# Patient Record
Sex: Female | Born: 1970 | Hispanic: Yes | Marital: Married | Smoking: Never smoker
Health system: Southern US, Community
[De-identification: ages and names within clinical notes are randomized; demographics above are authoritative.]

## PROBLEM LIST (undated history)

## (undated) DIAGNOSIS — E785 Hyperlipidemia, unspecified: Secondary | ICD-10-CM

## (undated) HISTORY — DX: Hyperlipidemia, unspecified: E78.5

---

## 2004-11-07 HISTORY — PX: BREAST EXCISIONAL BIOPSY: SUR124

## 2004-11-07 HISTORY — PX: BREAST CYST ASPIRATION: SHX578

## 2005-03-24 ENCOUNTER — Ambulatory Visit: Payer: Self-pay | Admitting: Family Medicine

## 2005-04-04 ENCOUNTER — Ambulatory Visit: Payer: Self-pay | Admitting: Family Medicine

## 2005-04-26 ENCOUNTER — Ambulatory Visit: Payer: Self-pay | Admitting: General Surgery

## 2005-05-26 ENCOUNTER — Ambulatory Visit: Payer: Self-pay | Admitting: General Surgery

## 2006-01-04 ENCOUNTER — Ambulatory Visit: Payer: Self-pay | Admitting: General Surgery

## 2006-07-14 ENCOUNTER — Ambulatory Visit: Payer: Self-pay | Admitting: General Surgery

## 2007-07-16 ENCOUNTER — Ambulatory Visit: Payer: Self-pay | Admitting: General Surgery

## 2008-08-26 ENCOUNTER — Ambulatory Visit: Payer: Self-pay

## 2008-11-07 HISTORY — PX: BREAST EXCISIONAL BIOPSY: SUR124

## 2008-11-14 ENCOUNTER — Ambulatory Visit: Payer: Self-pay | Admitting: Surgery

## 2008-11-21 ENCOUNTER — Ambulatory Visit: Payer: Self-pay | Admitting: Surgery

## 2009-12-22 ENCOUNTER — Ambulatory Visit: Payer: Self-pay

## 2010-11-05 ENCOUNTER — Ambulatory Visit: Payer: Self-pay | Admitting: Surgery

## 2011-11-09 ENCOUNTER — Ambulatory Visit: Payer: Self-pay

## 2013-01-02 ENCOUNTER — Ambulatory Visit: Payer: Self-pay

## 2013-12-30 ENCOUNTER — Emergency Department: Payer: Self-pay | Admitting: Surgery

## 2013-12-30 LAB — URINALYSIS, COMPLETE
BILIRUBIN, UR: NEGATIVE
Bacteria: NONE SEEN
Glucose,UR: NEGATIVE mg/dL (ref 0–75)
KETONE: NEGATIVE
Leukocyte Esterase: NEGATIVE
Nitrite: NEGATIVE
PH: 7 (ref 4.5–8.0)
PROTEIN: NEGATIVE
SPECIFIC GRAVITY: 1.009 (ref 1.003–1.030)
Squamous Epithelial: <1

## 2013-12-30 LAB — COMPREHENSIVE METABOLIC PANEL
ALBUMIN: 4 g/dL (ref 3.4–5.0)
ALT: 23 U/L (ref 12–78)
Alkaline Phosphatase: 88 U/L
Anion Gap: 1 — ABNORMAL LOW (ref 7–16)
BILIRUBIN TOTAL: 0.3 mg/dL (ref 0.2–1.0)
BUN: 14 mg/dL (ref 7–18)
CHLORIDE: 102 mmol/L (ref 98–107)
Calcium, Total: 9.1 mg/dL (ref 8.5–10.1)
Co2: 32 mmol/L (ref 21–32)
Creatinine: 0.71 mg/dL (ref 0.60–1.30)
EGFR (African American): >60
Glucose: 103 mg/dL — ABNORMAL HIGH (ref 65–99)
OSMOLALITY: 271 (ref 275–301)
POTASSIUM: 3.7 mmol/L (ref 3.5–5.1)
SGOT(AST): 25 U/L (ref 15–37)
SODIUM: 135 mmol/L — AB (ref 136–145)
Total Protein: 8.2 g/dL (ref 6.4–8.2)

## 2013-12-30 LAB — CBC WITH DIFFERENTIAL/PLATELET
BASOS PCT: 0.5 %
Basophil #: 0 10*3/uL (ref 0.0–0.1)
EOS ABS: 0.2 10*3/uL (ref 0.0–0.7)
EOS PCT: 2.9 %
HCT: 41.3 % (ref 35.0–47.0)
HGB: 14.1 g/dL (ref 12.0–16.0)
LYMPHS ABS: 1.7 10*3/uL (ref 1.0–3.6)
Lymphocyte %: 31.9 %
MCH: 31.3 pg (ref 26.0–34.0)
MCHC: 34.1 g/dL (ref 32.0–36.0)
MCV: 92 fL (ref 80–100)
Monocyte #: 0.4 x10 3/mm (ref 0.2–0.9)
Monocyte %: 6.8 %
Neutrophil #: 3 10*3/uL (ref 1.4–6.5)
Neutrophil %: 57.9 %
Platelet: 198 10*3/uL (ref 150–440)
RBC: 4.5 10*6/uL (ref 3.80–5.20)
RDW: 12.3 % (ref 11.5–14.5)
WBC: 5.2 10*3/uL (ref 3.6–11.0)

## 2013-12-30 LAB — LIPASE, BLOOD: Lipase: 117 U/L (ref 73–393)

## 2013-12-30 LAB — PREGNANCY, URINE: PREGNANCY TEST, URINE: NEGATIVE m[IU]/mL

## 2014-01-17 ENCOUNTER — Ambulatory Visit: Payer: Self-pay | Admitting: Family Medicine

## 2015-01-12 IMAGING — CT CT ABD-PELV W/ CM
2 of 5 series · 15 of 46 positions shown, 17 images · IV contrast (isovue)
Comparison: None.

CLINICAL DATA: Generalized abdominal pain which was earlier
left-sided. The patient reports vomiting

EXAM:
CT ABDOMEN AND PELVIS WITH CONTRAST
TECHNIQUE: Multidetector CT imaging of the abdomen and pelvis was performed
using the standard protocol following bolus administration of
intravenous contrast.
CONTRAST:  100 cc of Isovue 370 intravenously. The patient also
received oral contrast material.

[Series 2: routine abd pel with · axial · 0.68mm/px · z∈[-1013,-613]mm · 12 of 91 slices shown, 14 images]
[im 6/91  soft-tissue]
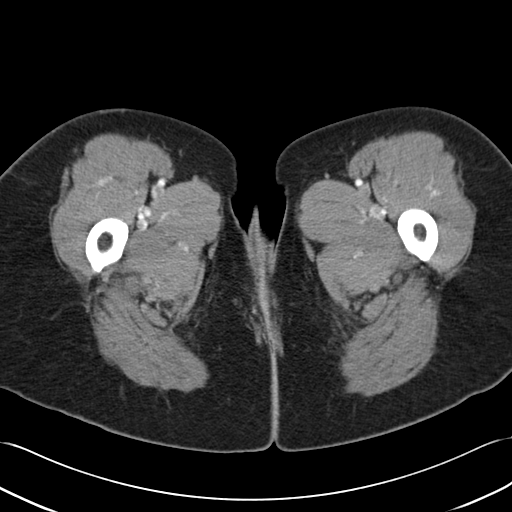
[im 6/91  bone]
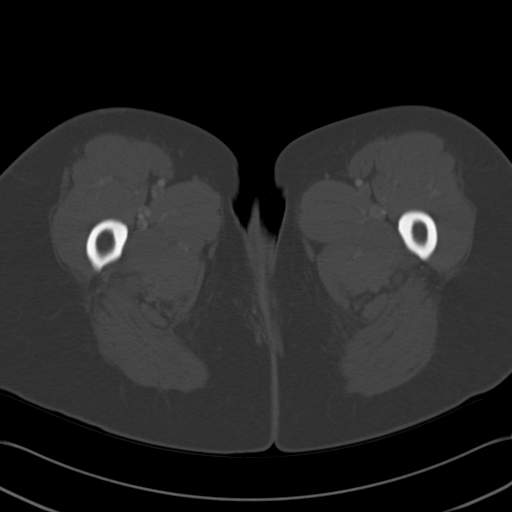
[im 16/91  soft-tissue]
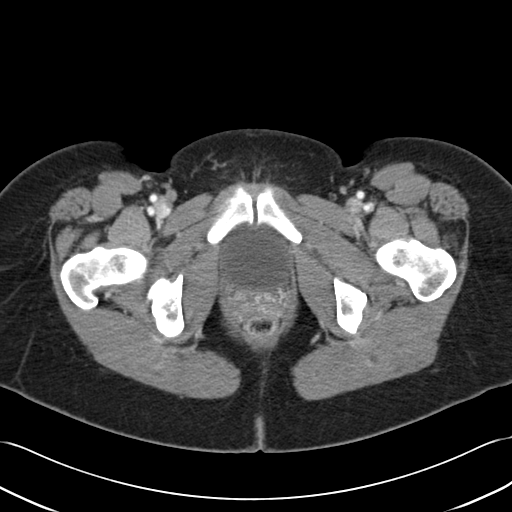
[im 21/91  soft-tissue]
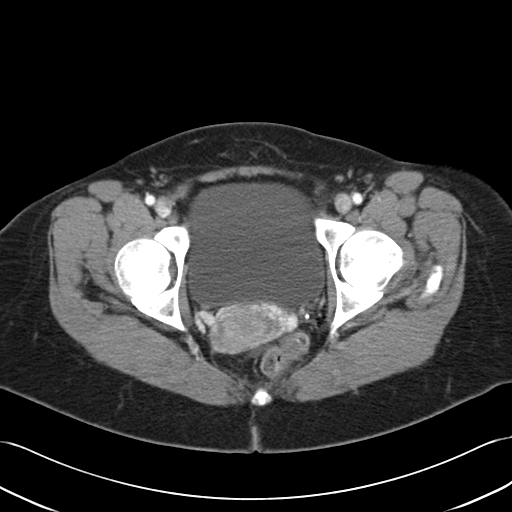
[im 26/91  soft-tissue]
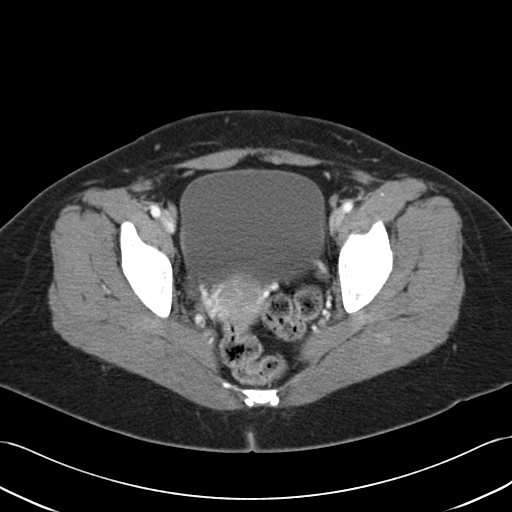
[im 36/91  soft-tissue]
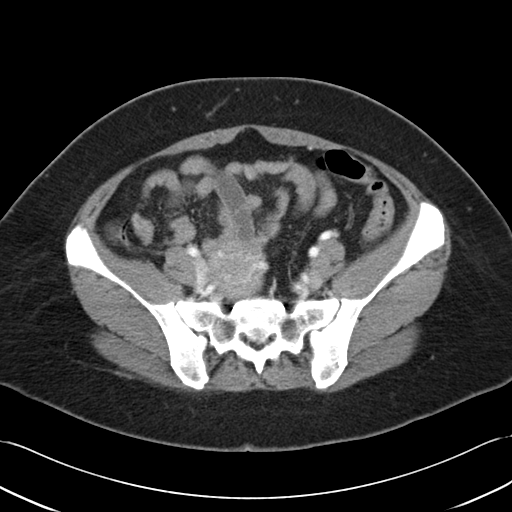
[im 41/91  soft-tissue]
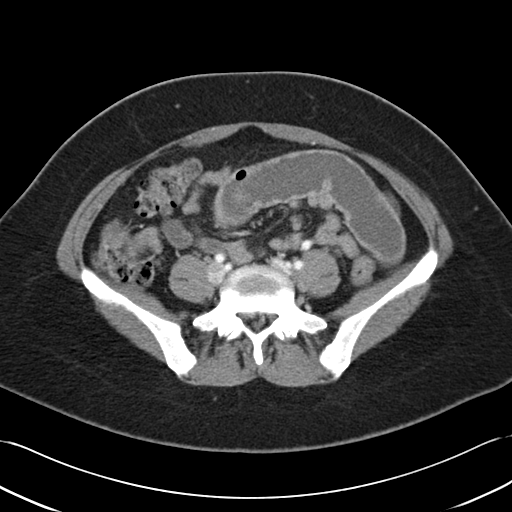
[im 51/91  soft-tissue]
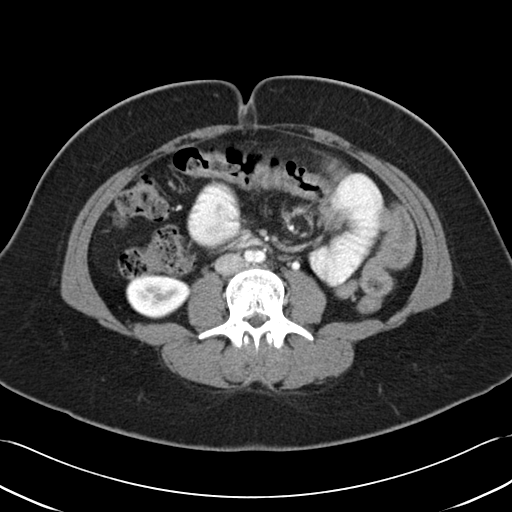
[im 56/91  soft-tissue]
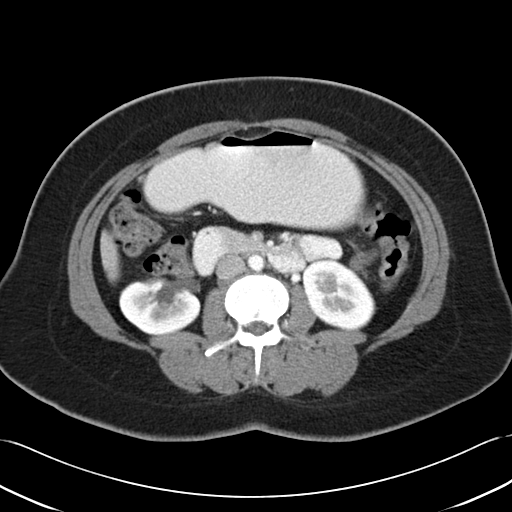
[im 66/91  soft-tissue]
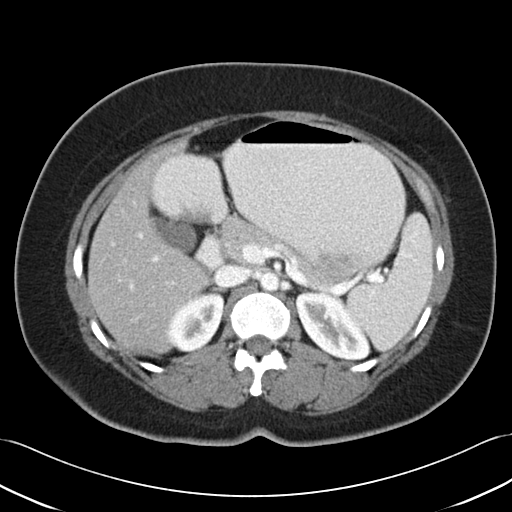
[im 66/91  bone]
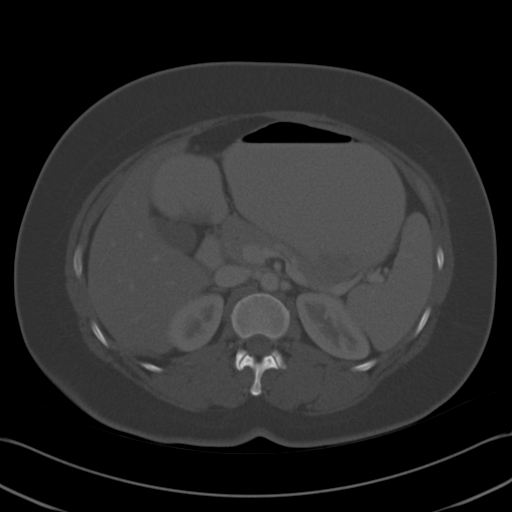
[im 71/91  soft-tissue]
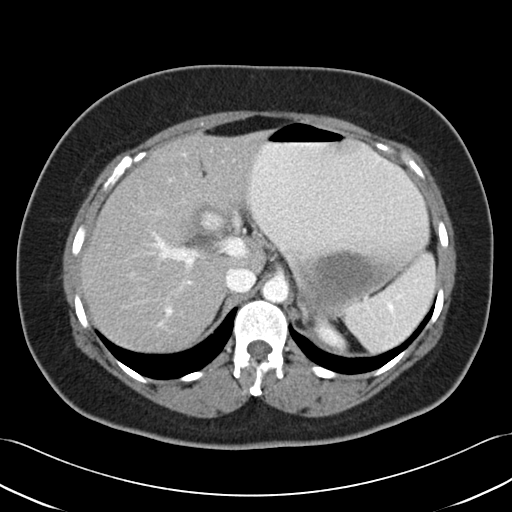
[im 76/91  soft-tissue]
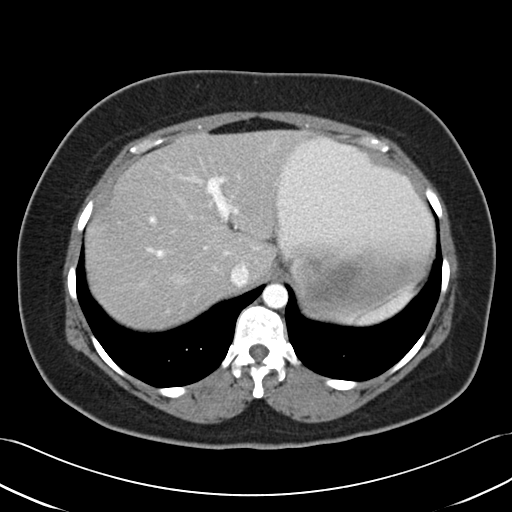
[im 86/91  soft-tissue]
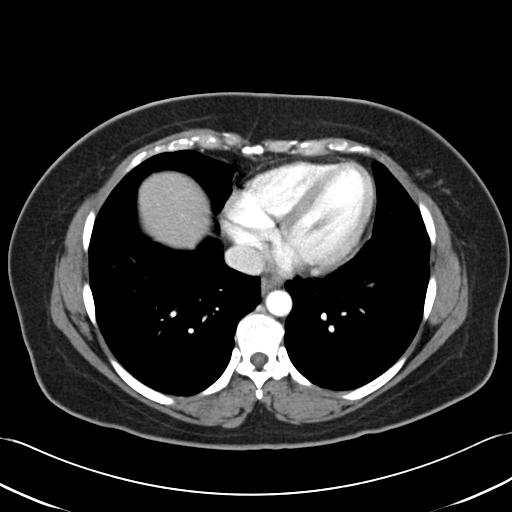

[Series 5: cor routine abd pel with · coronal · 0.60mm/px · 3 of 116 slices shown]
[im 39/116  soft-tissue]
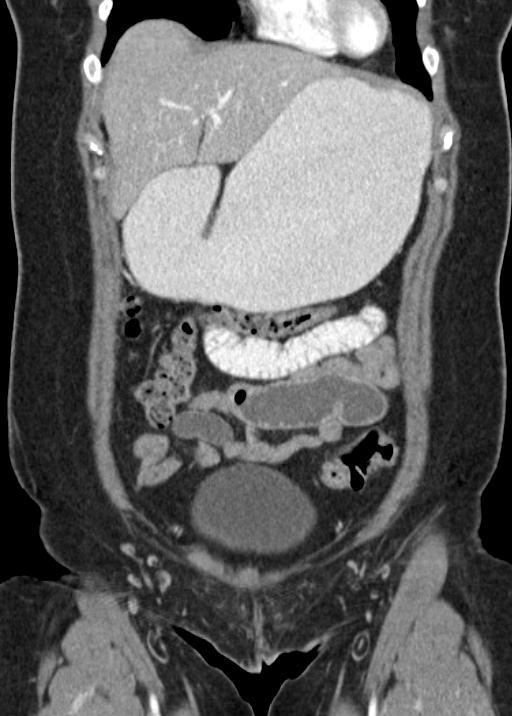
[im 52/116  soft-tissue]
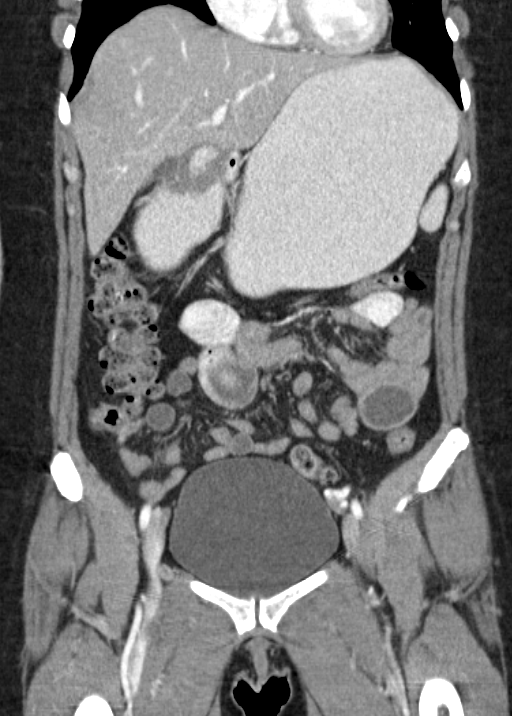
[im 64/116  soft-tissue]
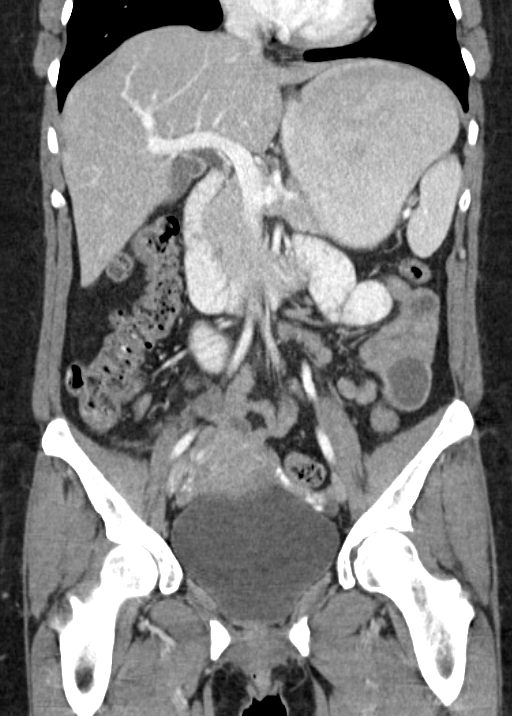

[15 of 46 positions shown; findings below may reference images not displayed]

FINDINGS: The stomach is moderately distended with contrast and food particles
and a small amount of gas. Loops of contrast filled proximal jejunum
are demonstrated in the upper abdomen. However, there is a
transition zone demonstrated on images 43 through 51 in the proximal
jejunum that has the appearance of an intussusception. Distal to
this no contrast is demonstrated. There is minimal distention of
bowel distal to the suspected site of intussusception which is
demonstrated on images 44 through 51. The colon exhibits a moderate
amount of stool in the ascending portion and in the rectosigmoid but
there is no evidence of obstruction. The appendix is normal in
caliber and partially gas-filled.

The liver, gallbladder, spleen, pancreas, adrenal glands, and
kidneys are normal in appearance. The caliber of the abdominal aorta
is normal. The psoas musculature is normal in appearance. The
urinary bladder is moderately distended. The uterus and adnexal
structures are within the limits of normal. There is no free fluid
within the pelvis. There is no inguinal nor significant umbilical
hernia.

The lung bases are clear. The lumbar vertebral bodies are preserved
in height.
IMPRESSION: 1. There is a relatively high-grade proximal jejunal obstruction
which appears to be related jejuno-jejunal intussusception. No
suspicious mesenteric mass is demonstrated. No bulky mesenteric
lymph nodes are demonstrated. The distal small bowel, appendix, and
colon exhibit no acute abnormalities.
2. There is no acute hepatobiliary or acute urinary tract
abnormality.
3. There is no free fluid in the abdomen or pelvis.
4. These results were called by telephone at the time of
interpretation on 12/30/2013 at [DATE] to Harsh De Oliveira, NP, who
verbally acknowledged these results.

## 2015-01-19 ENCOUNTER — Ambulatory Visit: Payer: Self-pay | Admitting: Family Medicine

## 2015-02-28 NOTE — Consult Note (Signed)
PATIENT NAME:  Martha Diaz, Martha Diaz MR#:  [id number] DATE OF BIRTH:  05-Jul-1971  DATE OF CONSULTATION:  12/30/2013  CONSULTING PHYSICIAN:  Loraine LericheMark A. Egbert GaribaldiBird, MD  HISTORY OF PRESENT ILLNESS: This is a 44 year old otherwise healthy Hispanic female who presented to the Emergency Room with 9 month history of nocturnal left upper quadrant intermittent abdominal pain. Over the last several days, the patient has had worsening abdominal pain and presents to the Emergency Room today with acute nausea, vomiting and severe left upper quadrant abdominal pain. Workup in the Emergency Room with a CT scan with oral contrast demonstrates what appears to be a complete small bowel obstruction of the mid jejunum secondary to intussusception. There is no evidence of mesenteric lymphadenopathy. There is no free fluid. There is no pneumatosis intestinalis. Surgical services were asked to evaluate.  She had a large emesis in ER and now is pain free.   ALLERGIES: None.   MEDICATIONS: None.   PAST MEDICAL HISTORY: None.   PAST SURGICAL HISTORY: None.   SOCIAL HISTORY: She is employed and married. Does not smoke. Does not drink.   PHYSICAL EXAMINATION: VITAL SIGNS:  The patient has a nasogastric tube in place. Draining bilious fluid. Temperature is 97.5, pulse of 60. Blood pressure is 150/71.  GENERAL:  Affect is normal.  LUNGS: Clear.  HEART: Regular rate and rhythm.  NECK: Without adenopathy or thyromegaly.  ABDOMEN: Soft and nontender. There are no scars. No hernia present.  EXTREMITIES: Warm and well perfused.  NEUROPSYCHIATRIC: Unremarkable.   REVIEW OF SYSTEMS: As described above.     LABORATORY VALUES: Urine pregnancy test is negative. Urinalysis is negative. White count 5.2, hemoglobin 14.1, sodium 135, lipase 117. Liver function tests are normal. Platelet count 198,000.   Review of CT scan was personally interpreted the on the PACS monitor and I agree with the radiologist's interpretation.   IMPRESSION: A  44 year old Hispanic white female without past medical history, long-standing history of abdominal pain and acute onset of nausea, vomiting and abdominal pain in left upper quadrant with the findings on CT scan suggestive of complete bowel obstruction secondary to jejunal intussusception.   RECOMMENDATIONS: I recommended to the patient admission to the hospital, nasogastric tube decompression. Followup x-rays in the morning and likely surgical intervention given the chronicity of her disease and the likelihood of an associated pathological finding as the lead point for the intussusception. With the help of a Spanish interpreter, this was discussed with her at length. The patient decided that she would like to go home. I told her that this would be AGAINST MEDICAL ADVICE and had her sign the appropriate forms. I did give my contact information and encouraged her to return or follow up in the office when she saw fit. She understands the gravity of the nature of the problem and the likelihood of her returning if the intussusception is continuing, which I think it will be. There was a substantial amount of edema seen on the CT scan, and I think that all of her symptoms are suggestive of a worsening process, which will likely need surgical intervention in the form of a bowel obstruction.   TOTAL TIME SPENT: 60 minutes.   ____________________________ Redge GainerMark A. Egbert GaribaldiBird, MD FACS mab:dmm D: 12/30/2013 20:45:20 ET Diaz: 12/30/2013 21:25:59 ET JOB#: [id number]  cc: Loraine LericheMark A. Egbert GaribaldiBird, MD, <Dictator> Deantae Shackleton A Berdine Rasmusson MD ELECTRONICALLY SIGNED 01/08/2014 10:06

## 2016-01-27 ENCOUNTER — Ambulatory Visit
Admission: RE | Admit: 2016-01-27 | Discharge: 2016-01-27 | Disposition: A | Discharge status: Home / Self Care | Payer: Self-pay | Source: Ambulatory Visit | Attending: Oncology | Admitting: Oncology

## 2016-01-27 ENCOUNTER — Ambulatory Visit: Payer: Self-pay | Attending: Oncology

## 2016-01-27 VITALS — BP 108/72 | HR 62 | Temp 98.2°F | Ht 60.63 in | Wt 168.7 lb

## 2016-01-27 DIAGNOSIS — Z0000 Encounter for general adult medical examination without abnormal findings: Secondary | ICD-10-CM

## 2016-01-27 NOTE — Progress Notes (Signed)
Subjective:     Patient ID: Martha Diaz, female   DOB: 11/01/1971, 45 y.o.   MRN: [id number]  HPI   Review of Systems     Objective:   Physical Exam  Pulmonary/Chest: Right breast exhibits no inverted nipple, no mass, no nipple discharge, no skin change and no tenderness. Left breast exhibits no inverted nipple, no mass, no nipple discharge, no skin change and no tenderness. Breasts are symmetrical.       Assessment:     45 year old Hispanic patient presents for [facility] clinic visit.  Patient screened, and meets [facility] eligibility.  Patient does not have insurance, Medicare or Medicaid.  Handout given on Affordable Care Act.  Instructed patient on breast self-exam using teach back method.  CBE unremarkable.  No mass or lump palpated. Delos HaringLoyda Murr interpreted exam.    Plan:     Sent for bilateral screening mammogram.

## 2016-02-02 NOTE — Progress Notes (Signed)
Letter mailed from [facility] to notify of normal mammogram results.  Patient to return in one year for annual screening.  Copy to [facility]. 

## 2017-01-30 ENCOUNTER — Ambulatory Visit
Admission: RE | Admit: 2017-01-30 | Discharge: 2017-01-30 | Disposition: A | Discharge status: Home / Self Care | Payer: Self-pay | Source: Ambulatory Visit | Attending: Oncology | Admitting: Oncology

## 2017-01-30 ENCOUNTER — Ambulatory Visit: Payer: Self-pay | Attending: Oncology | Admitting: *Deleted

## 2017-01-30 ENCOUNTER — Encounter (INDEPENDENT_AMBULATORY_CARE_PROVIDER_SITE_OTHER): Payer: Self-pay

## 2017-01-30 ENCOUNTER — Encounter: Payer: Self-pay | Admitting: *Deleted

## 2017-01-30 VITALS — BP 107/72 | HR 65 | Temp 98.5°F | Ht 62.0 in | Wt 175.0 lb

## 2017-01-30 DIAGNOSIS — Z0000 Encounter for general adult medical examination without abnormal findings: Secondary | ICD-10-CM

## 2017-01-30 NOTE — Progress Notes (Signed)
Subjective:     Patient ID: Martha Diaz, female   DOB: 12/21/1970, 46 y.o.   MRN: [id number]  HPI   Review of Systems     Objective:   Physical Exam  Pulmonary/Chest: Right breast exhibits no inverted nipple, no mass, no nipple discharge, no skin change and no tenderness. Left breast exhibits no inverted nipple, no mass, no nipple discharge, no skin change and no tenderness. Breasts are symmetrical.       Assessment:     46 [facility] Hispanic [facility]  Lloyda, the interpreter present during the interview and exam.  Clinical breast exam unremarkable.  Taught self breast awareness.  Last pap 2014.  Patient states she is having her Norplant birth control removed.  She is to call [place] back if her provider does not do her pap smear and we can complete that for her.  Patient has been screened for eligibility.  She does not have any insurance, Medicare or Medicaid.  She also meets financial eligibility.  Hand-out given on the Affordable Care Act.    Plan:     Screening mammogram ordered.  Will follow-up per [facility] protocol.

## 2017-01-30 NOTE — Patient Instructions (Signed)
Gave patient hand-out, Women Staying Healthy, Active and Well from [facility], with education on breast health, pap smears, heart and colon health. 

## 2017-02-01 ENCOUNTER — Encounter: Payer: Self-pay | Admitting: *Deleted

## 2017-02-01 NOTE — Progress Notes (Signed)
Letter mailed to inform patient of her normal mammogram results.  She is to follow-up in one year with annual screening.  [facility] to [place]. 

## 2018-01-31 ENCOUNTER — Encounter (INDEPENDENT_AMBULATORY_CARE_PROVIDER_SITE_OTHER): Payer: Self-pay

## 2018-01-31 ENCOUNTER — Ambulatory Visit
Admission: RE | Admit: 2018-01-31 | Discharge: 2018-01-31 | Disposition: A | Discharge status: Home / Self Care | Payer: Self-pay | Source: Ambulatory Visit | Attending: Oncology | Admitting: Oncology

## 2018-01-31 ENCOUNTER — Ambulatory Visit: Payer: Self-pay | Attending: Oncology | Admitting: *Deleted

## 2018-01-31 VITALS — BP 124/73 | HR 69 | Temp 97.1°F | Ht 62.0 in | Wt 177.0 lb

## 2018-01-31 DIAGNOSIS — Z0000 Encounter for general adult medical examination without abnormal findings: Secondary | ICD-10-CM

## 2018-01-31 NOTE — Progress Notes (Signed)
Subjective:     Patient ID: Celesta AverMaria T Boozer, female   DOB: 03/24/1971, 47 y.o.   MRN: [id number]  HPI   Review of Systems     Objective:   Physical Exam  Pulmonary/Chest: Right breast exhibits no inverted nipple, no mass, no nipple discharge, no skin change and no tenderness. Left breast exhibits no inverted nipple, no mass, no nipple discharge, no skin change and no tenderness. Breasts are symmetrical.  Bilateral flat nipples.  Patient states normal for her.       Assessment:     47 year old Hispanic [facility]  Lloyda, the interpreter present during the interview and exam.  Clinical breast exam reveals flat bilateral nipples.  Patient states this is normal for her.  Taught self breast awareness.  States she has "swollen glands" and that she has reflux.  There are no palpable cervical or submandibular enlarged glands.  Encouraged over the counter treatment for reflux and discuss with her primary care provider.  Last pap on 03/13/17 was negative / no HPV co-testing.  Next pap due in 2021.  Patient has been screened for eligibility.  She does not have any insurance, Medicare or Medicaid.  She also meets financial eligibility.  Hand-out given on the Affordable Care Act.    Plan:     Screening mammogram ordered.  Will follow-up per [facility] protocol.

## 2018-01-31 NOTE — Patient Instructions (Signed)
Gave patient hand-out, Women Staying Healthy, Active and Well from [facility], with education on breast health, pap smears, heart and colon health. 

## 2019-07-23 ENCOUNTER — Other Ambulatory Visit: Payer: Self-pay

## 2019-07-24 ENCOUNTER — Ambulatory Visit: Payer: Self-pay | Attending: Oncology

## 2019-07-24 ENCOUNTER — Encounter (INDEPENDENT_AMBULATORY_CARE_PROVIDER_SITE_OTHER): Payer: Self-pay

## 2019-07-24 ENCOUNTER — Other Ambulatory Visit: Payer: Self-pay

## 2019-07-24 ENCOUNTER — Ambulatory Visit
Admission: RE | Admit: 2019-07-24 | Discharge: 2019-07-24 | Disposition: A | Discharge status: Home / Self Care | Payer: Self-pay | Source: Ambulatory Visit | Attending: Oncology | Admitting: Oncology

## 2019-07-24 VITALS — BP 118/70 | HR 62 | Temp 98.2°F | Ht 60.0 in | Wt 170.0 lb

## 2019-07-24 DIAGNOSIS — Z0000 Encounter for general adult medical examination without abnormal findings: Secondary | ICD-10-CM | POA: Insufficient documentation

## 2019-07-24 NOTE — Progress Notes (Signed)
  Subjective:     Patient ID: Martha Diaz, female   DOB: July 30, 1971, 48 y.o.   MRN: [id number]  HPI   Review of Systems     Objective:   Physical Exam Chest:     Breasts:        Right: No swelling, bleeding, inverted nipple, mass, nipple discharge, skin change or tenderness.        Left: No swelling, bleeding, inverted nipple, mass, nipple discharge, skin change or tenderness.       Comments: Right breast biopsy scar       Assessment:     48 year old patient returns for [facility] clinic visit. Patient screened, and meets [facility] eligibility.  Patient does not have insurance, Medicare or Medicaid. Instructed patient on breast self awareness using teach back method.  Clinical breast exam unremarkable. Reports intermittent left side twinge like pain.  States she has been walking with hand weights.  No pain at this time.  Jaqui Laukaitis interpreted exam.    Plan:     Sent for bilateral screening mammogram.

## 2019-07-24 NOTE — Progress Notes (Signed)
Ms  

## 2019-07-25 NOTE — Progress Notes (Signed)
Patient ID: Martha Diaz, female   DOB: [birth year], 48 y.o.   MRN: [id number] Letter mailed from [facility] to notify of normal mammogram results.  Patient to return in one year for annual screening.  Copy to [facility].

## 2021-07-28 ENCOUNTER — Other Ambulatory Visit: Payer: Self-pay

## 2021-07-28 ENCOUNTER — Ambulatory Visit
Admission: RE | Admit: 2021-07-28 | Discharge: 2021-07-28 | Disposition: A | Discharge status: Home / Self Care | Payer: Self-pay | Source: Ambulatory Visit | Attending: Oncology | Admitting: Oncology

## 2021-07-28 ENCOUNTER — Ambulatory Visit: Payer: Self-pay | Attending: Oncology

## 2021-07-28 VITALS — BP 119/68 | HR 83 | Temp 97.8°F | Resp 16 | Wt 178.5 lb

## 2021-07-28 DIAGNOSIS — Z0000 Encounter for general adult medical examination without abnormal findings: Secondary | ICD-10-CM | POA: Insufficient documentation

## 2021-07-28 NOTE — Progress Notes (Signed)
  Subjective:     Patient ID: Martha Diaz, female   DOB: 08/25/1971, 50 y.o.   MRN: [id number]  Gynecologic Exam    Review of Systems     Objective:   Physical Exam Chest:  Breasts:    Right: No swelling, bleeding, inverted nipple, mass, nipple discharge, skin change or tenderness.     Left: No swelling, bleeding, inverted nipple, mass, nipple discharge, skin change or tenderness.       Assessment:     50 year old Hispanic patient returns for [facility] screening. Patient screened, and meets [facility] eligibility.  Patient does not have insurance, Medicare or Medicaid.  Instructed patient on breast self awareness using teach back method.  Clinical breast exam unremarkable.  No mass or lump palpated.   Risk Assessment     Risk Scores       07/28/2021 07/24/2019   Last edited by: Dimitri Ped, CMA Jim Like, RN   5-year risk: 1.2 % 1.1 %   Lifetime risk: 9.5 % 9.9 %               Plan:     Sent for bilateral screening mammogram.

## 2021-07-29 ENCOUNTER — Encounter: Payer: Self-pay | Admitting: Family Medicine

## 2021-08-04 NOTE — Progress Notes (Signed)
Letter mailed from [facility] to notify of normal mammogram results.  Patient to return in one year for annual screening.  Copy to [facility]. 

## 2022-10-07 ENCOUNTER — Other Ambulatory Visit: Payer: Self-pay

## 2022-10-07 DIAGNOSIS — Z1231 Encounter for screening mammogram for malignant neoplasm of breast: Secondary | ICD-10-CM

## 2022-10-10 ENCOUNTER — Ambulatory Visit
Admission: RE | Admit: 2022-10-10 | Discharge: 2022-10-10 | Disposition: A | Discharge status: Home / Self Care | Payer: Self-pay | Source: Ambulatory Visit | Attending: Obstetrics and Gynecology | Admitting: Obstetrics and Gynecology

## 2022-10-10 ENCOUNTER — Ambulatory Visit: Payer: Self-pay | Attending: Hematology and Oncology | Admitting: Hematology and Oncology

## 2022-10-10 ENCOUNTER — Other Ambulatory Visit: Payer: Self-pay

## 2022-10-10 VITALS — BP 110/77 | Wt 165.9 lb

## 2022-10-10 DIAGNOSIS — Z1231 Encounter for screening mammogram for malignant neoplasm of breast: Secondary | ICD-10-CM | POA: Insufficient documentation

## 2022-10-10 DIAGNOSIS — Z1211 Encounter for screening for malignant neoplasm of colon: Secondary | ICD-10-CM

## 2022-10-10 NOTE — Patient Instructions (Signed)
Taught Celesta Aver about self breast awareness and gave educational materials to take home. Patient did not need a Pap smear today due to last Pap smear was in 2021 per patient. Will be due again in 2024. Told patient about free cervical cancer screenings to receive a Pap smear if would like one next year. Let her know [facility] will cover Pap smears every 5 years unless has a history of abnormal Pap smears. [facility]. Appointment scheduled for 10/10/22. Patient aware of appointment and will be there. Let patient know will follow up with her within the next couple weeks with results. Celesta Aver verbalized understanding.  Pascal Lux, NP 2:45 PM

## 2022-10-10 NOTE — Progress Notes (Signed)
[name] is a 51 y.o. female who presents to [facility] clinic today with no complaints.    Pap Smear: Pap not smear completed today. Last Pap smear was 2021 at [facility] clinic and was normal. Per patient has no history of an abnormal Pap smear. Last Pap smear result is available in Epic.   Physical exam: Breasts Breasts symmetrical. No skin abnormalities bilateral breasts. No nipple retraction bilateral breasts. No nipple discharge bilateral breasts. No lymphadenopathy. No lumps palpated bilateral breasts.     MS DIGITAL SCREENING TOMO BILATERAL  Result Date: 08/03/2021 CLINICAL DATA:  Screening. EXAM: DIGITAL SCREENING BILATERAL MAMMOGRAM WITH TOMOSYNTHESIS AND CAD TECHNIQUE: Bilateral screening digital craniocaudal and mediolateral oblique mammograms were obtained. Bilateral screening digital breast tomosynthesis was performed. The images were evaluated with computer-aided detection. COMPARISON:  Previous exam(s). ACR Breast Density Category c: The breast tissue is heterogeneously dense, which may obscure small masses. FINDINGS: There are no findings suspicious for malignancy. IMPRESSION: No mammographic evidence of malignancy. A result letter of this screening mammogram will be mailed directly to the patient. RECOMMENDATION: Screening mammogram in one year. (Code:SM-B-01Y) BI-RADS CATEGORY  1: Negative. Electronically Signed   By: Amie Portland M.D.   On: 08/03/2021 16:24  MS DIGITAL SCREENING TOMO BILATERAL  Result Date: 07/25/2019 CLINICAL DATA:  Screening. EXAM: DIGITAL SCREENING BILATERAL MAMMOGRAM WITH TOMO AND CAD COMPARISON:  Previous exam(s). ACR Breast Density Category c: The breast tissue is heterogeneously dense, which may obscure small masses. FINDINGS: There are no findings suspicious for malignancy. Images were processed with CAD. IMPRESSION: No mammographic evidence of malignancy. A result letter of this screening mammogram will be mailed directly to  the patient. RECOMMENDATION: Screening mammogram in one year. (Code:SM-B-01Y) BI-RADS CATEGORY  1: Negative. Electronically Signed   By: Annia Belt M.D.   On: 07/25/2019 10:28   MS DIGITAL SCREENING TOMO BILATERAL  Result Date: 01/31/2018 CLINICAL DATA:  Screening. EXAM: DIGITAL SCREENING BILATERAL MAMMOGRAM WITH TOMO AND CAD COMPARISON:  Previous exam(s). ACR Breast Density Category c: The breast tissue is heterogeneously dense, which may obscure small masses. FINDINGS: There are no findings suspicious for malignancy. Images were processed with CAD. IMPRESSION: No mammographic evidence of malignancy. A result letter of this screening mammogram will be mailed directly to the patient. RECOMMENDATION: Screening mammogram in one year. (Code:SM-B-01Y) BI-RADS CATEGORY  1: Negative. Electronically Signed   By: Norva Pavlov M.D.   On: 01/31/2018 14:38      Pelvic/Bimanual Pap is not indicated today    Smoking History: Patient has never smoked and was not referred to quit line.    Patient Navigation: Patient education provided. Access to services provided for patient through [facility] program. Rito Ehrlich interpreter provided. No transportation provided   Colorectal Cancer Screening: Per patient has never had colonoscopy completed No complaints today. FIT test given.   Breast and Cervical Cancer Risk Assessment: Patient does not have family history of breast cancer, known genetic mutations, or radiation treatment to the chest before age 68. Patient does not have history of cervical dysplasia, immunocompromised, or DES exposure in-utero.  Risk Assessment   No risk assessment data for the current encounter  Risk Scores       07/28/2021   Last edited by: Dimitri Ped, CMA   5-year risk: 1.2 %   Lifetime risk: 9.5 %            A: [facility] exam without pap smear No complaints with benign exam.   P:  [facility] to the [facility] for a screening mamm[facility]. Appointment  scheduled 10/10/22.  Martha Lux, NP 10/10/2022 2:44 PM

## 2022-10-18 LAB — FECAL OCCULT BLOOD, IMMUNOCHEMICAL: Fecal Occult Bld: NEGATIVE

## 2022-11-10 ENCOUNTER — Telehealth: Payer: Self-pay

## 2022-11-10 NOTE — Telephone Encounter (Signed)
Normal FIT test letter mailed.   Enero 4, 2024     Estimado(a) Martha Diaz,   Gracias por realizar la prueba FIT (prueba inmunoqumica fecal) como parte de la evaluacin para la deteccin del Surveyor, minerals. Nos complace informarle que el resultado de su prueba ha sido negativo.  La prueba FIT no es un examen completo para la deteccin del cncer colorrectal. No todos los cnceres colorrectales se detectan mediante esta prueba. Es importante que le comunique [employer] a su mdico. Adems, le recomendamos que por favor vaya a su mdico si presenta sntomas como cambios en los hbitos intestinales, dolor abdominal o sangre en las heces fecales.  Si tiene [employer] de estas pruebas, llame a [employer] al  [phone].  Atentamente,    Marjory Lies, LPN [facility]/Community Outreach (Programa comunitario

## 2023-10-04 ENCOUNTER — Other Ambulatory Visit: Payer: Self-pay

## 2023-10-04 DIAGNOSIS — Z1231 Encounter for screening mammogram for malignant neoplasm of breast: Secondary | ICD-10-CM

## 2023-10-16 ENCOUNTER — Ambulatory Visit
Admission: RE | Admit: 2023-10-16 | Discharge: 2023-10-16 | Disposition: A | Discharge status: Home / Self Care | Payer: Self-pay | Source: Ambulatory Visit | Attending: Obstetrics and Gynecology | Admitting: Obstetrics and Gynecology

## 2023-10-16 ENCOUNTER — Ambulatory Visit: Payer: Self-pay | Attending: Hematology and Oncology | Admitting: Hematology and Oncology

## 2023-10-16 VITALS — BP 128/67 | Wt 160.0 lb

## 2023-10-16 DIAGNOSIS — Z1231 Encounter for screening mammogram for malignant neoplasm of breast: Secondary | ICD-10-CM | POA: Insufficient documentation

## 2023-10-16 DIAGNOSIS — Z124 Encounter for screening for malignant neoplasm of cervix: Secondary | ICD-10-CM

## 2023-10-16 NOTE — Patient Instructions (Signed)
Taught Martha Diaz about self breast awareness and gave educational materials to take home. Patient did not need a Pap smear today due to last Pap smear was in 06/11/2020 per patient.  Let her know [facility] will cover Pap smears every 5 years unless has a history of abnormal Pap smears. Referred patient to the [facility] for screening mammogram. Appointment scheduled for 10/16/2023. Patient aware of appointment and will be there. Let patient know will follow up with her within the next couple weeks with results. Martha Diaz verbalized understanding.  Pascal Lux, NP 8:46 AM

## 2023-10-16 NOTE — Progress Notes (Unsigned)
Martha Diaz is a 52 y.o. No obstetric history on file. female who presents to [facility] clinic today with no complaints.    Pap Smear: Pap smear completed today. Last Pap smear was 06/11/2020 at [facility] clinic and was normal. Per patient has no history of an abnormal Pap smear. Last Pap smear result is available in Epic.   Physical exam: Breasts Breasts symmetrical. No skin abnormalities bilateral breasts. No nipple retraction bilateral breasts. No nipple discharge bilateral breasts. No lymphadenopathy. No lumps palpated bilateral breasts.     MS DIGITAL SCREENING TOMO BILATERAL  Result Date: 10/11/2022 CLINICAL DATA:  Screening. EXAM: DIGITAL SCREENING BILATERAL MAMMOGRAM WITH TOMOSYNTHESIS AND CAD TECHNIQUE: Bilateral screening digital craniocaudal and mediolateral oblique mammograms were obtained. Bilateral screening digital breast tomosynthesis was performed. The images were evaluated with computer-aided detection. COMPARISON:  Previous exam(s). ACR Breast Density Category c: The breast tissue is heterogeneously dense, which may obscure small masses. FINDINGS: There are no findings suspicious for malignancy. IMPRESSION: No mammographic evidence of malignancy. A result letter of this screening mammogram will be mailed directly to the patient. RECOMMENDATION: Screening mammogram in one year. (Code:SM-B-01Y) BI-RADS CATEGORY  1: Negative. Electronically Signed   By: Bary Richard M.D.   On: 10/11/2022 16:33   MS DIGITAL SCREENING TOMO BILATERAL  Result Date: 08/03/2021 CLINICAL DATA:  Screening. EXAM: DIGITAL SCREENING BILATERAL MAMMOGRAM WITH TOMOSYNTHESIS AND CAD TECHNIQUE: Bilateral screening digital craniocaudal and mediolateral oblique mammograms were obtained. Bilateral screening digital breast tomosynthesis was performed. The images were evaluated with computer-aided detection. COMPARISON:  Previous exam(s). ACR Breast Density Category c: The breast tissue is heterogeneously  dense, which may obscure small masses. FINDINGS: There are no findings suspicious for malignancy. IMPRESSION: No mammographic evidence of malignancy. A result letter of this screening mammogram will be mailed directly to the patient. RECOMMENDATION: Screening mammogram in one year. (Code:SM-B-01Y) BI-RADS CATEGORY  1: Negative. Electronically Signed   By: Amie Portland M.D.   On: 08/03/2021 16:24  MS DIGITAL SCREENING TOMO BILATERAL  Result Date: 07/25/2019 CLINICAL DATA:  Screening. EXAM: DIGITAL SCREENING BILATERAL MAMMOGRAM WITH TOMO AND CAD COMPARISON:  Previous exam(s). ACR Breast Density Category c: The breast tissue is heterogeneously dense, which may obscure small masses. FINDINGS: There are no findings suspicious for malignancy. Images were processed with CAD. IMPRESSION: No mammographic evidence of malignancy. A result letter of this screening mammogram will be mailed directly to the patient. RECOMMENDATION: Screening mammogram in one year. (Code:SM-B-01Y) BI-RADS CATEGORY  1: Negative. Electronically Signed   By: Annia Belt M.D.   On: 07/25/2019 10:28      Pelvic/Bimanual Ext Genitalia No lesions, no swelling and no discharge observed on external genitalia.        Vagina Vagina pink and normal texture. No lesions or discharge observed in vagina.        Cervix Cervix is present. Cervix pink and of normal texture. No discharge observed.    Uterus Uterus is present and palpable. Uterus in normal position and normal size.        Adnexae Bilateral ovaries present and palpable. No tenderness on palpation.         Rectovaginal No rectal exam completed today since patient had no rectal complaints. No skin abnormalities observed on exam.     Smoking History: Patient has never smoked and was not referred to quit line.    Patient Navigation: Patient education provided. Access to services provided for patient through [facility] program. [name] interpreter provided. No transportation  provided   Colorectal Cancer Screening: Per patient has never had colonoscopy completed No complaints today. FIT test completed.    Breast and Cervical Cancer Risk Assessment: Patient does not have family history of breast cancer, known genetic mutations, or radiation treatment to the chest before age 61. Patient does not have history of cervical dysplasia, immunocompromised, or DES exposure in-utero.  Risk Scores as of Encounter on 10/16/2023     Martha Diaz           5-year 1.48%   Lifetime 11.6%            Last calculated by Caprice Red, CMA on 10/16/2023 at  8:29 AM          A: [facility] exam with pap smear No complaints with benign exam.   P: Referred patient to the [facility] for a screening mammogram. Appointment scheduled 10/16/2023.  Ilda Basset A, NP 10/16/2023 8:29 AM

## 2023-10-18 LAB — CYTOLOGY - PAP
Comment: NEGATIVE
Diagnosis: NEGATIVE
High risk HPV: NEGATIVE

## 2024-10-08 ENCOUNTER — Telehealth: Payer: Self-pay

## 2024-10-24 ENCOUNTER — Other Ambulatory Visit: Payer: Self-pay | Admitting: Family Medicine

## 2024-10-24 DIAGNOSIS — Z1231 Encounter for screening mammogram for malignant neoplasm of breast: Secondary | ICD-10-CM

## 2024-11-25 ENCOUNTER — Ambulatory Visit
Admission: RE | Admit: 2024-11-25 | Discharge: 2024-11-25 | Disposition: A | Payer: Self-pay | Source: Ambulatory Visit | Attending: Family Medicine | Admitting: Family Medicine

## 2024-11-25 DIAGNOSIS — Z1231 Encounter for screening mammogram for malignant neoplasm of breast: Secondary | ICD-10-CM | POA: Insufficient documentation
